# Patient Record
Sex: Female | Born: 1990 | Race: White | Hispanic: No | Marital: Single | Smoking: Current every day smoker
Health system: Southern US, Community
[De-identification: ages and names within clinical notes are randomized; demographics above are authoritative.]

## PROBLEM LIST (undated history)

## (undated) HISTORY — PX: WISDOM TOOTH EXTRACTION: SHX21

---

## 2013-09-18 ENCOUNTER — Encounter (HOSPITAL_COMMUNITY): Payer: Self-pay | Admitting: Emergency Medicine

## 2013-09-18 ENCOUNTER — Emergency Department (HOSPITAL_COMMUNITY)
Admission: EM | Admit: 2013-09-18 | Discharge: 2013-09-18 | Disposition: A | Discharge status: Home / Self Care | Payer: 59 | Attending: Emergency Medicine | Admitting: Emergency Medicine

## 2013-09-18 DIAGNOSIS — F17200 Nicotine dependence, unspecified, uncomplicated: Secondary | ICD-10-CM | POA: Insufficient documentation

## 2013-09-18 DIAGNOSIS — L03119 Cellulitis of unspecified part of limb: Secondary | ICD-10-CM

## 2013-09-18 DIAGNOSIS — L02619 Cutaneous abscess of unspecified foot: Secondary | ICD-10-CM | POA: Insufficient documentation

## 2013-09-18 MED ORDER — CEPHALEXIN 500 MG PO CAPS
500.0000 mg | ORAL_CAPSULE | Freq: Four times a day (QID) | ORAL | Status: AC
Start: 2013-09-18 — End: ?

## 2013-09-18 NOTE — ED Notes (Signed)
Pt reports pain l/ankle x 1 day. Ankle swollen, tender and red. Denies trauma. Recent hx of open blister on heel

## 2013-09-18 NOTE — ED Provider Notes (Signed)
CSN: [id number]     Arrival date & time 09/18/13  1501 History  This chart was scribed for non-physician practitioner Renne CriglerJoshua Bacilio Abascal, PA-C, working with Flint MelterElliott L Wentz, MD, by Yevette EdwardsAngela Bracken, ED Scribe. This patient was seen in room WTR9/WTR9 and the patient's care was started at 4:26 PM.  First MD Initiated Contact with Patient 09/18/13 1558     Chief Complaint  Patient presents with  . Ankle Pain    l/ankle pain x 1 day    The history is provided by the patient. No language interpreter was used.   HPI Comments: Melanie Cline is a 23 y.o. female who presents to the Emergency Department complaining of left ankle pain which began today and has been associated with swelling and redness. She denies recent injury or trauma, but she reports a blister to to her heel which she noticed three days ago. She attributes the blister to shoes she worse. She has treated the blister with rubbing alcohol and neosporin. She has treated the ankle pain with IBU. She denies a fever, emesis, or h/o DM. She also denies known allergies. Onset is acute. Course is worsening. Aggravating factors are none. Alleviating factors are none.   History reviewed. No pertinent past medical history. Past Surgical History  Procedure Laterality Date  . Wisdom tooth extraction     Family History  Problem Relation Age of Onset  . Cancer Other    History  Substance Use Topics  . Smoking status: Current Every Day Smoker    Types: Cigarettes  . Smokeless tobacco: Not on file  . Alcohol Use: Yes   No OB history provided.  Review of Systems  Constitutional: Negative for fever and activity change.  Gastrointestinal: Negative for vomiting.  Musculoskeletal: Negative for arthralgias, back pain, joint swelling and neck pain.  Skin: Positive for color change. Negative for wound.  Neurological: Negative for weakness and numbness.    Allergies  Review of patient's allergies indicates no known allergies.  Home Medications    Current Outpatient Rx  Name  Route  Sig  Dispense  Refill  . ibuprofen (ADVIL,MOTRIN) 800 MG tablet   Oral   Take 800 mg by mouth every 8 (eight) hours as needed for headache, moderate pain or cramping.         . Multiple Vitamin (MULTIVITAMIN WITH MINERALS) TABS tablet   Oral   Take 1 tablet by mouth daily.         . norethindrone (MICRONOR,CAMILA,ERRIN) 0.35 MG tablet   Oral   Take 1 tablet by mouth daily.          Triage Vitals: BP 104/77  Pulse 85  Temp(Src) 98.5 F (36.9 C) (Oral)  Resp 18  Wt 160 lb (72.576 kg)  SpO2 98%  LMP 09/11/2013  Physical Exam  Nursing note and vitals reviewed. Constitutional: She appears well-developed and well-nourished. No distress.  HENT:  Head: Normocephalic and atraumatic.  Eyes: EOM are normal. Pupils are equal, round, and reactive to light.  Neck: Normal range of motion. Neck supple. No tracheal deviation present.  Cardiovascular: Normal rate.  Exam reveals no decreased pulses.   Pulmonary/Chest: Effort normal. No respiratory distress.  Musculoskeletal: Normal range of motion. She exhibits tenderness. She exhibits no edema.  Neurological: She is alert. No sensory deficit.  Motor, sensation, and vascular distal to the injury is fully intact.   Skin: Skin is warm and dry. There is erythema.  1 cm diameter superficially open wound to left heel with several  cms of surrounding erythymatous and mild warmth consistent with cellulitis.   Psychiatric: She has a normal mood and affect. Her behavior is normal.    ED Course  Procedures (including critical care time)  DIAGNOSTIC STUDIES: Oxygen Saturation is 98% on room air, normal by my interpretation.    COORDINATION OF CARE:  4:30 PM- Discussed treatment plan with patient, and the patient agreed to the plan. The plan includes an antibiotic.   Labs Review Labs Reviewed - No data to display Imaging Review No results found.   EKG Interpretation None      5:10 PM Patient  seen and examined.    Vital signs reviewed and are as follows: Filed Vitals:   09/18/13 1541  BP: 104/77  Pulse: 85  Temp: 98.5 F (36.9 C)  Resp: 18   Pt urged to return with worsening pain, worsening swelling, expanding area of redness or streaking up extremity, fever, or any other concerns. Urged to take complete course of antibiotics as prescribed. Pt verbalizes understanding and agrees with plan.  MDM   Final diagnoses:  Cellulitis of foot   Patient with mild cellulitis from open blister. Patient appears well. No concern for septic arthritis or osteomyelitis. Patient does not have any immunocompromising conditions. Oral antibiotics indicated with close monitoring, return precautions discussed.  I personally performed the services described in this documentation, which was scribed in my presence. The recorded information has been reviewed and is accurate.    Renne Crigler, PA-C 09/18/13 1714

## 2013-09-18 NOTE — Discharge Instructions (Signed)
Please read and follow all provided instructions.  Your diagnoses today include:  1. Cellulitis of foot     Tests performed today include:  Vital signs. See below for your results today.   Medications prescribed:   Keflex (cephalexin) - antibiotic  You have been prescribed an antibiotic medicine: take the entire course of medicine even if you are feeling better. Stopping early can cause the antibiotic not to work.  Take any prescribed medications only as directed.   Home care instructions:  Follow any educational materials contained in this packet. Keep affected area above the level of your heart when possible. Wash area gently twice a day with warm soapy water. Do not apply alcohol or hydrogen peroxide. Cover the area if it draining or weeping.   Follow-up instructions: Return to the Emergency Department in 48 hours for a recheck if your symptoms are not significantly improved.   Please follow-up with your primary care provider in the next 1 week for further evaluation of your symptoms. If you do not have a primary care doctor -- see below for referral information.   Return instructions:  Return to the Emergency Department if you have:  Fever  Worsening symptoms  Worsening pain  Worsening swelling  Redness of the skin that moves away from the affected area, especially if it streaks away from the affected area   Any other emergent concerns  Your vital signs today were: BP 104/77   Pulse 85   Temp(Src) 98.5 F (36.9 C) (Oral)   Resp 18   Wt 160 lb (72.576 kg)   SpO2 98%   LMP 09/11/2013 If your blood pressure (BP) was elevated above 135/85 this visit, please have this repeated by your doctor within one month. --------------

## 2013-09-18 NOTE — ED Provider Notes (Signed)
Medical screening examination/treatment/procedure(s) were performed by non-physician practitioner and as supervising physician I was immediately available for consultation/collaboration.  Flint MelterElliott L Cashae Weich, MD 09/18/13 [phone]

## 2013-10-05 ENCOUNTER — Encounter (HOSPITAL_COMMUNITY): Payer: Self-pay | Admitting: Emergency Medicine

## 2013-10-05 ENCOUNTER — Emergency Department (HOSPITAL_COMMUNITY)
Admission: EM | Admit: 2013-10-05 | Discharge: 2013-10-06 | Disposition: A | Discharge status: Home / Self Care | Payer: 59 | Attending: Emergency Medicine | Admitting: Emergency Medicine

## 2013-10-05 DIAGNOSIS — W010XXA Fall on same level from slipping, tripping and stumbling without subsequent striking against object, initial encounter: Secondary | ICD-10-CM | POA: Insufficient documentation

## 2013-10-05 DIAGNOSIS — S92309A Fracture of unspecified metatarsal bone(s), unspecified foot, initial encounter for closed fracture: Secondary | ICD-10-CM | POA: Insufficient documentation

## 2013-10-05 DIAGNOSIS — F17200 Nicotine dependence, unspecified, uncomplicated: Secondary | ICD-10-CM | POA: Insufficient documentation

## 2013-10-05 DIAGNOSIS — Z792 Long term (current) use of antibiotics: Secondary | ICD-10-CM | POA: Insufficient documentation

## 2013-10-05 DIAGNOSIS — Y9389 Activity, other specified: Secondary | ICD-10-CM | POA: Insufficient documentation

## 2013-10-05 DIAGNOSIS — Z79899 Other long term (current) drug therapy: Secondary | ICD-10-CM | POA: Insufficient documentation

## 2013-10-05 DIAGNOSIS — S99199A Other physeal fracture of unspecified metatarsal, initial encounter for closed fracture: Secondary | ICD-10-CM

## 2013-10-05 DIAGNOSIS — Y929 Unspecified place or not applicable: Secondary | ICD-10-CM | POA: Insufficient documentation

## 2013-10-05 NOTE — ED Notes (Signed)
Pt c/o L foot pain after tripping while taking out trash @ 2255 tonight.

## 2013-10-06 ENCOUNTER — Emergency Department (HOSPITAL_COMMUNITY): Payer: 59

## 2013-10-06 MED ORDER — HYDROCODONE-ACETAMINOPHEN 5-325 MG PO TABS
2.0000 | ORAL_TABLET | Freq: Once | ORAL | Status: AC
  Administered 2013-10-06: 2 via ORAL
  Filled 2013-10-06: qty 2

## 2013-10-06 MED ORDER — NAPROXEN 500 MG PO TABS: 500.0000 mg | ORAL_TABLET | Freq: Two times a day (BID) | ORAL | Status: AC

## 2013-10-06 NOTE — Discharge Instructions (Signed)
Wear a boot on your leg at all times. Use crutches when walking to refrain from bearing weight on your left foot. Call Dr. Shon BatonBrooks, from orthopedics, on Monday to schedule appointment for follow up during the week. Take Naproxen for pain control as prescribed. Return if symptoms worsen.  RICE: Routine Care for Injuries The routine care of many injuries includes Rest, Ice, Compression, and Elevation (RICE). HOME CARE INSTRUCTIONS  Rest is needed to allow your body to heal. Routine activities can usually be resumed when comfortable. Injured tendons and bones can take up to 6 weeks to heal. Tendons are the cord-like structures that attach muscle to bone.  Ice following an injury helps keep the swelling down and reduces pain.  Put ice in a plastic bag.  Place a towel between your skin and the bag.  Leave the ice on for 15-20 minutes, 03-04 times a day. Do this while awake, for the first 24 to 48 hours. After that, continue as directed by your caregiver.  Compression helps keep swelling down. It also gives support and helps with discomfort. If an elastic bandage has been applied, it should be removed and reapplied every 3 to 4 hours. It should not be applied tightly, but firmly enough to keep swelling down. Watch fingers or toes for swelling, bluish discoloration, coldness, numbness, or excessive pain. If any of these problems occur, remove the bandage and reapply loosely. Contact your caregiver if these problems continue.  Elevation helps reduce swelling and decreases pain. With extremities, such as the arms, hands, legs, and feet, the injured area should be placed near or above the level of the heart, if possible. SEEK IMMEDIATE MEDICAL CARE IF:  You have persistent pain and swelling.  You develop redness, numbness, or unexpected weakness.  Your symptoms are getting worse rather than improving after several days. These symptoms may indicate that further evaluation or further X-rays are needed.  Sometimes, X-rays may not show a small broken bone (fracture) until 1 week or 10 days later. Make a follow-up appointment with your caregiver. Ask when your X-ray results will be ready. Make sure you get your X-ray results. Document Released: 09/19/2000 Document Revised: 08/30/2011 Document Reviewed: 11/06/2010 [facility] Patient Information 2014 [place], [place].

## 2013-10-06 NOTE — ED Provider Notes (Signed)
CSN: [id number]     Arrival date & time 10/05/13  2344 History   First MD Initiated Contact with Patient 10/06/13 0014     Chief Complaint  Patient presents with  . Foot Injury    (Consider location/radiation/quality/duration/timing/severity/associated sxs/prior Treatment) Patient is a 23 y.o. female presenting with foot injury. The history is provided by the patient. No language interpreter was used.  Foot Injury Location:  Foot Time since incident:  2 hours Injury: yes   Mechanism of injury comment:  Tripped while taking out trash Foot location:  L foot Pain details:    Quality:  Aching and throbbing   Radiates to:  Does not radiate   Severity:  Moderate   Onset quality:  Sudden   Duration:  2 hours   Timing:  Constant   Progression:  Unchanged Chronicity:  New Prior injury to area:  No Relieved by:  None tried Worsened by:  Bearing weight (and palpation) Ineffective treatments:  None tried Associated symptoms: no decreased ROM, no fever, no muscle weakness, no numbness, no stiffness and no swelling   Risk factors: no known bone disorder and no obesity     History reviewed. No pertinent past medical history. Past Surgical History  Procedure Laterality Date  . Wisdom tooth extraction     Family History  Problem Relation Age of Onset  . Cancer Other    History  Substance Use Topics  . Smoking status: Current Every Day Smoker    Types: Cigarettes  . Smokeless tobacco: Not on file  . Alcohol Use: Yes   OB History   Grav Para Term Preterm Abortions TAB SAB Ect Mult Living                  Review of Systems  Constitutional: Negative for fever.  Musculoskeletal: Positive for arthralgias and myalgias. Negative for stiffness.  Skin: Negative for color change.  Neurological: Negative for syncope, weakness and numbness.  All other systems reviewed and are negative.    Allergies  Review of patient's allergies indicates no known allergies.  Home Medications    Prior to Admission medications   Medication Sig Start Date End Date Taking? Authorizing Provider  cephALEXin (KEFLEX) 500 MG capsule Take 1 capsule (500 mg total) by mouth 4 (four) times daily. 09/18/13   Renne CriglerJoshua Geiple, PA-C  ibuprofen (ADVIL,MOTRIN) 800 MG tablet Take 800 mg by mouth every 8 (eight) hours as needed for headache, moderate pain or cramping.    Historical Provider, MD  Multiple Vitamin (MULTIVITAMIN WITH MINERALS) TABS tablet Take 1 tablet by mouth daily.    Historical Provider, MD  norethindrone (MICRONOR,CAMILA,ERRIN) 0.35 MG tablet Take 1 tablet by mouth daily.    Historical Provider, MD   BP 116/57  Pulse 96  Temp(Src) 98 F (36.7 C) (Oral)  Resp 16  Ht 5\' 5"  (1.651 m)  Wt 165 lb (74.844 kg)  BMI 27.46 kg/m2  SpO2 99%  LMP 09/11/2013  Physical Exam  Nursing note and vitals reviewed. Constitutional: She is oriented to person, place, and time. She appears well-developed and well-nourished. No distress.  HENT:  Head: Normocephalic and atraumatic.  Eyes: Conjunctivae and EOM are normal. No scleral icterus.  Neck: Normal range of motion.  Cardiovascular: Normal rate, regular rhythm and intact distal pulses.   Pulses:      Dorsalis pedis pulses are 2+ on the left side.       Posterior tibial pulses are 2+ on the left side.  Pulmonary/Chest: Effort normal.  No respiratory distress.  Musculoskeletal: Normal range of motion. She exhibits tenderness.       Right foot: She exhibits tenderness and bony tenderness. She exhibits normal range of motion, normal capillary refill, no crepitus and no deformity.       Feet:  Neurological: She is alert and oriented to person, place, and time. No sensory deficit. GCS eye subscore is 4. GCS verbal subscore is 5. GCS motor subscore is 6.  Reflex Scores:      Patellar reflexes are 2+ on the left side.      Achilles reflexes are 2+ on the left side. No numbness, tingling or weakness of the affected extremity. Patient able to wiggle  all toes.  Skin: Skin is warm and dry. No rash noted. She is not diaphoretic. No erythema. No pallor.  Psychiatric: She has a normal mood and affect. Her behavior is normal.    ED Course  Procedures (including critical care time) Labs Review Labs Reviewed - No data to display  Imaging Review Dg Foot Complete Left  10/06/2013   CLINICAL DATA:  Mid foot and toe pain near of the left third and fourth metatarsals after trip and fall injury this evening.  EXAM: LEFT FOOT - COMPLETE 3+ VIEW  COMPARISON:  None.  FINDINGS: Transverse linear lucency along the base of the left fifth metatarsal bone consistent with nondisplaced fracture. Left foot is otherwise intact. Soft tissues are unremarkable.  IMPRESSION: Transverse nondisplaced fracture of the proximal left fifth metatarsal bone.   Electronically Signed   By: Burman NievesWilliam  Stevens M.D.   On: 10/06/2013 00:36     EKG Interpretation None      MDM   Final diagnoses:  Jones fracture    23 year old female presents to the emergency department for left foot pain after a mechanical trip while taking out the trash. Patient with point tenderness to her left lateral foot. She is neurovascularly intact without sensory deficits. No crepitus, deformity, erythema, or skin changes. Imaging today shows a nondisplaced fracture of the proximal fifth metatarsal of patient's left foot. Have consulted with Dr. Shon BatonBrooks who recommends Cam Walker and nonweightbearing. He also requests that the patient call his office on Monday to schedule follow up. Patient stable and appropriate for discharge. RICE and naproxen advised for symptom management. Return precautions provided and patient agreeable to plan with no unaddressed concerns.   Filed Vitals:   10/05/13 2352  BP: 116/57  Pulse: 96  Temp: 98 F (36.7 C)  TempSrc: Oral  Resp: 16  Height: 5\' 5"  (1.651 m)  Weight: 165 lb (74.844 kg)  SpO2: 99%       Antony MaduraKelly Drystan Reader, PA-C 10/06/13 0124

## 2013-10-07 NOTE — ED Provider Notes (Signed)
Medical screening examination/treatment/procedure(s) were performed by non-physician practitioner and as supervising physician I was immediately available for consultation/collaboration.   EKG Interpretation None        Nyala Kirchner, MD 10/07/13 0709 

## 2015-04-15 ENCOUNTER — Other Ambulatory Visit: Payer: Self-pay | Admitting: Family Medicine

## 2015-04-15 DIAGNOSIS — N6320 Unspecified lump in the left breast, unspecified quadrant: Secondary | ICD-10-CM

## 2015-04-21 ENCOUNTER — Ambulatory Visit
Admission: RE | Admit: 2015-04-21 | Discharge: 2015-04-21 | Disposition: A | Discharge status: Home / Self Care | Payer: 59 | Source: Ambulatory Visit | Attending: Family Medicine | Admitting: Family Medicine

## 2015-04-21 DIAGNOSIS — N6320 Unspecified lump in the left breast, unspecified quadrant: Secondary | ICD-10-CM

## 2015-08-14 IMAGING — CR DG FOOT COMPLETE 3+V*L*
3 series · 3 of 3 positions shown · non-contrast
Comparison: None.

CLINICAL DATA: Mid foot and toe pain near of the left third and
fourth metatarsals after trip and fall injury this evening.

EXAM:
LEFT FOOT - COMPLETE 3+ VIEW

[x foot ap left]
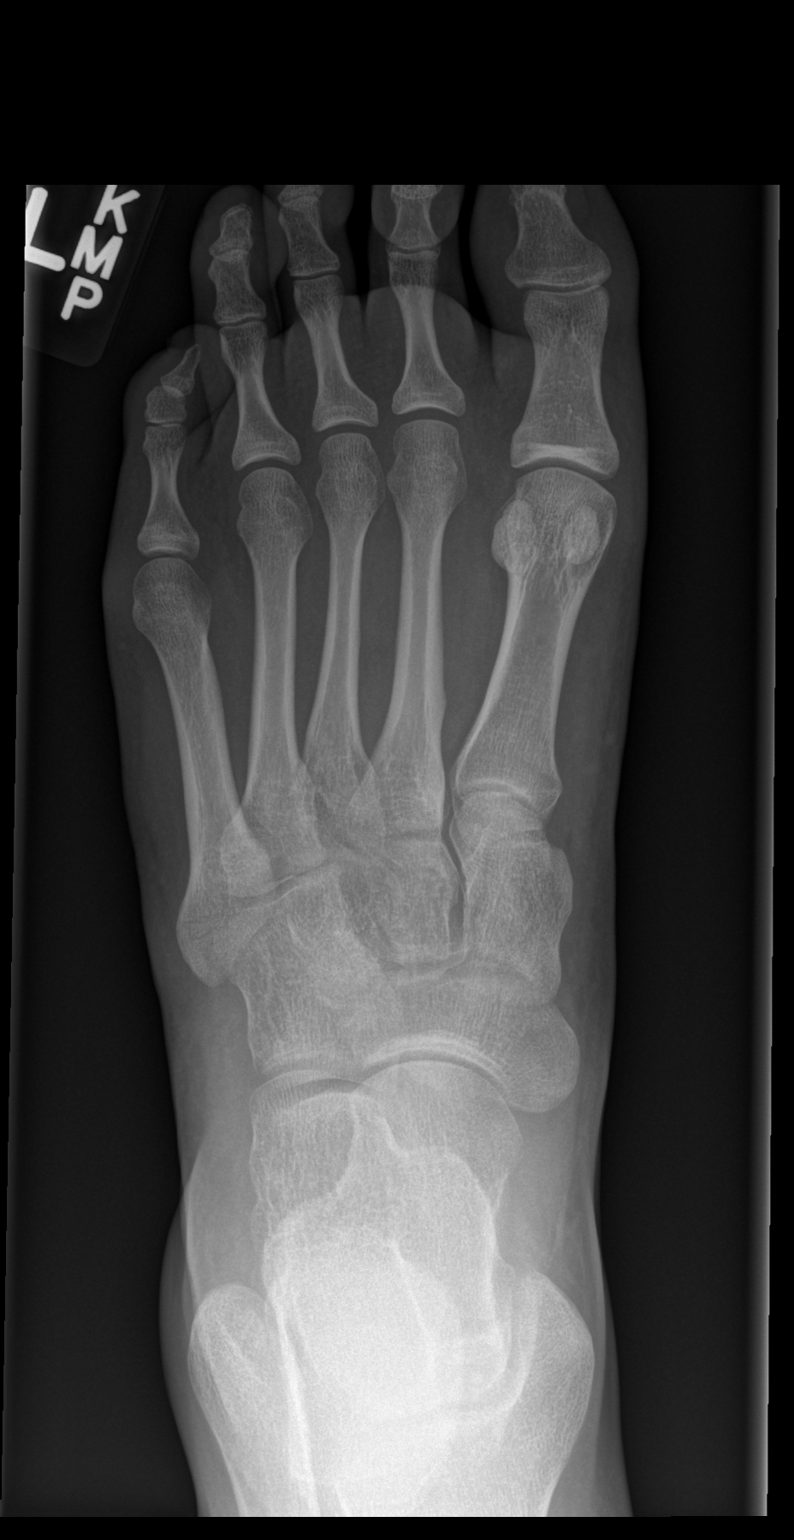

[x foot obl left]
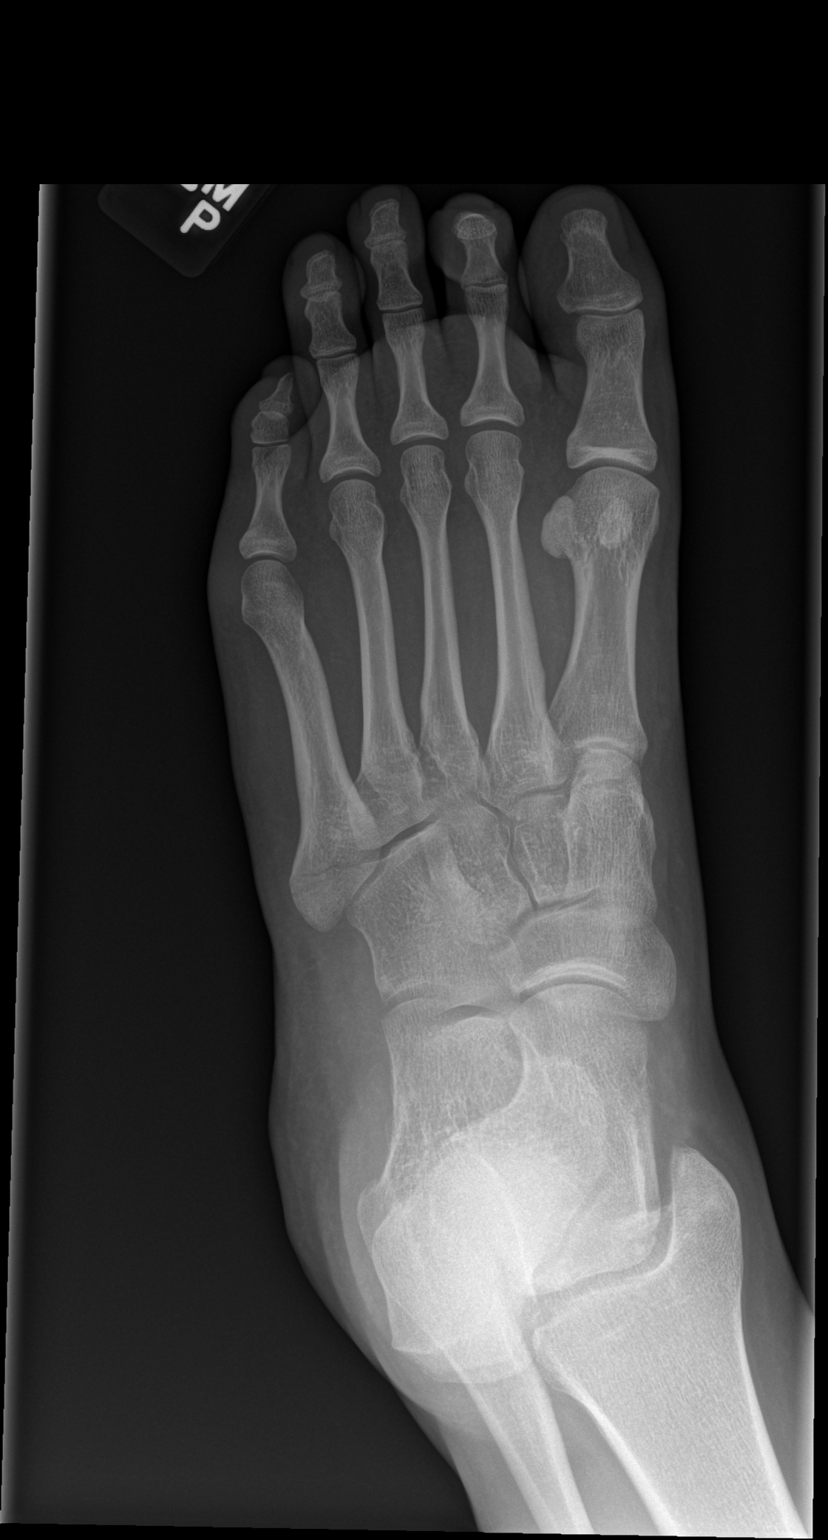

[x foot lat left]
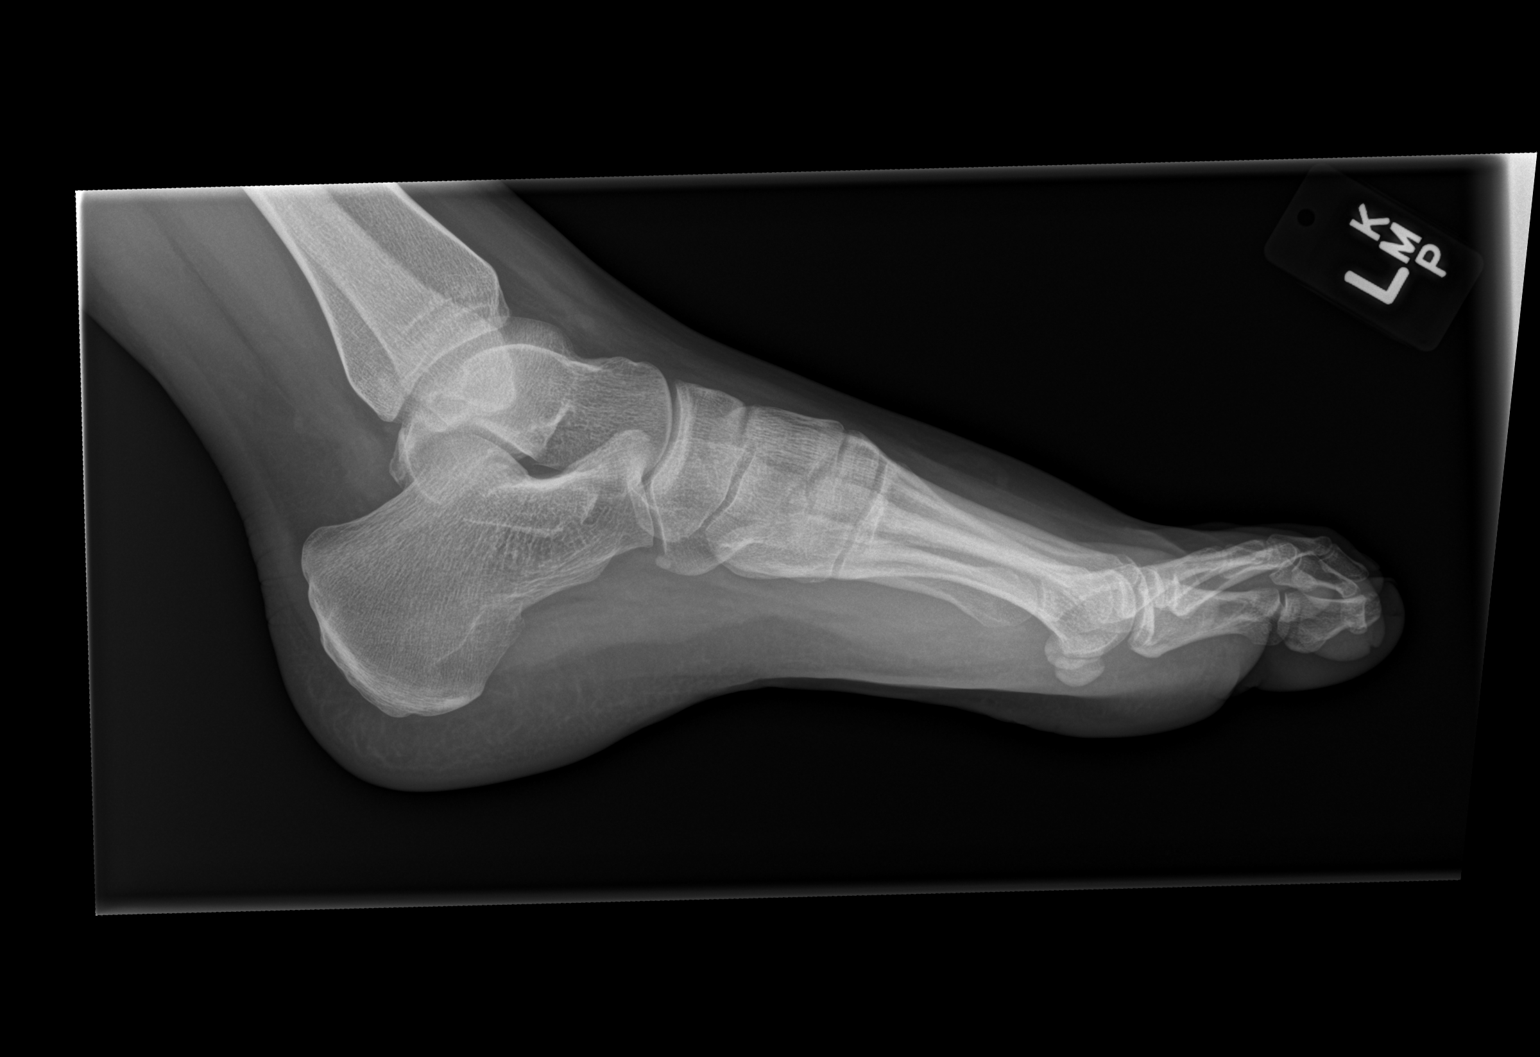

[3 of 3 positions shown; findings below may reference images not displayed]

FINDINGS: Transverse linear lucency along the base of the left fifth
metatarsal bone consistent with nondisplaced fracture. Left foot is
otherwise intact. Soft tissues are unremarkable.
IMPRESSION: Transverse nondisplaced fracture of the proximal left fifth
metatarsal bone.
